# Patient Record
Sex: Female | Born: 1935 | Race: Black or African American | Hispanic: No | Marital: Married | Smoking: Former smoker
Health system: Southern US, Community
[De-identification: ages and names within clinical notes are randomized; demographics above are authoritative.]

## PROBLEM LIST (undated history)

## (undated) DIAGNOSIS — E079 Disorder of thyroid, unspecified: Secondary | ICD-10-CM

## (undated) DIAGNOSIS — I10 Essential (primary) hypertension: Secondary | ICD-10-CM

## (undated) DIAGNOSIS — D649 Anemia, unspecified: Secondary | ICD-10-CM

## (undated) DIAGNOSIS — M1990 Unspecified osteoarthritis, unspecified site: Secondary | ICD-10-CM

## (undated) DIAGNOSIS — K219 Gastro-esophageal reflux disease without esophagitis: Secondary | ICD-10-CM

## (undated) HISTORY — DX: Disorder of thyroid, unspecified: E07.9

## (undated) HISTORY — DX: Anemia, unspecified: D64.9

## (undated) HISTORY — DX: Essential (primary) hypertension: I10

## (undated) HISTORY — DX: Unspecified osteoarthritis, unspecified site: M19.90

## (undated) HISTORY — DX: Gastro-esophageal reflux disease without esophagitis: K21.9

## (undated) HISTORY — PX: ABDOMINAL HYSTERECTOMY: SHX81

---

## 1997-09-03 ENCOUNTER — Other Ambulatory Visit: Admission: RE | Admit: 1997-09-03 | Discharge: 1997-09-03 | Payer: Self-pay | Admitting: Obstetrics and Gynecology

## 1998-12-30 ENCOUNTER — Other Ambulatory Visit: Admission: RE | Admit: 1998-12-30 | Discharge: 1998-12-30 | Payer: Self-pay | Admitting: Obstetrics and Gynecology

## 2000-01-10 ENCOUNTER — Other Ambulatory Visit: Admission: RE | Admit: 2000-01-10 | Discharge: 2000-01-10 | Payer: Self-pay | Admitting: Obstetrics and Gynecology

## 2001-01-21 ENCOUNTER — Other Ambulatory Visit: Admission: RE | Admit: 2001-01-21 | Discharge: 2001-01-21 | Payer: Self-pay | Admitting: Obstetrics and Gynecology

## 2002-02-12 ENCOUNTER — Other Ambulatory Visit: Admission: RE | Admit: 2002-02-12 | Discharge: 2002-02-12 | Payer: Self-pay | Admitting: Obstetrics and Gynecology

## 2003-06-03 ENCOUNTER — Ambulatory Visit (HOSPITAL_COMMUNITY): Admission: RE | Admit: 2003-06-03 | Discharge: 2003-06-03 | Payer: Self-pay | Admitting: Internal Medicine

## 2003-07-26 ENCOUNTER — Ambulatory Visit (HOSPITAL_COMMUNITY): Admission: RE | Admit: 2003-07-26 | Discharge: 2003-07-26 | Payer: Self-pay | Admitting: Neurology

## 2006-03-20 ENCOUNTER — Ambulatory Visit: Payer: Self-pay | Admitting: Internal Medicine

## 2006-03-28 ENCOUNTER — Ambulatory Visit: Payer: Self-pay | Admitting: Internal Medicine

## 2008-01-16 ENCOUNTER — Ambulatory Visit (HOSPITAL_COMMUNITY): Admission: RE | Admit: 2008-01-16 | Discharge: 2008-01-16 | Payer: Self-pay | Admitting: Orthopedic Surgery

## 2011-03-02 ENCOUNTER — Encounter: Payer: Self-pay | Admitting: Internal Medicine

## 2011-05-02 DIAGNOSIS — N951 Menopausal and female climacteric states: Secondary | ICD-10-CM

## 2011-05-02 DIAGNOSIS — I10 Essential (primary) hypertension: Secondary | ICD-10-CM

## 2011-06-13 ENCOUNTER — Ambulatory Visit (INDEPENDENT_AMBULATORY_CARE_PROVIDER_SITE_OTHER): Payer: PRIVATE HEALTH INSURANCE | Admitting: Obstetrics and Gynecology

## 2011-06-13 DIAGNOSIS — N951 Menopausal and female climacteric states: Secondary | ICD-10-CM

## 2011-12-14 ENCOUNTER — Encounter: Payer: Self-pay | Admitting: Internal Medicine

## 2012-01-06 ENCOUNTER — Encounter: Payer: Self-pay | Admitting: Obstetrics and Gynecology

## 2012-01-06 DIAGNOSIS — R921 Mammographic calcification found on diagnostic imaging of breast: Secondary | ICD-10-CM | POA: Insufficient documentation

## 2012-01-06 DIAGNOSIS — R922 Inconclusive mammogram: Secondary | ICD-10-CM | POA: Insufficient documentation

## 2012-01-07 ENCOUNTER — Other Ambulatory Visit: Payer: Self-pay

## 2012-01-16 ENCOUNTER — Encounter: Payer: Self-pay | Admitting: Obstetrics and Gynecology

## 2012-02-28 ENCOUNTER — Telehealth: Payer: Self-pay | Admitting: Obstetrics and Gynecology

## 2016-07-18 ENCOUNTER — Ambulatory Visit (HOSPITAL_COMMUNITY)
Admission: EM | Admit: 2016-07-18 | Discharge: 2016-07-18 | Disposition: A | Discharge status: Home / Self Care | Payer: Medicare Other | Attending: Internal Medicine | Admitting: Internal Medicine

## 2016-07-18 ENCOUNTER — Encounter (HOSPITAL_COMMUNITY): Payer: Self-pay | Admitting: Family Medicine

## 2016-07-18 DIAGNOSIS — M25562 Pain in left knee: Secondary | ICD-10-CM | POA: Diagnosis not present

## 2016-07-18 DIAGNOSIS — S86912A Strain of unspecified muscle(s) and tendon(s) at lower leg level, left leg, initial encounter: Secondary | ICD-10-CM

## 2016-07-18 NOTE — ED Triage Notes (Signed)
Pt here for pain to left leg after attending water aerobics a few days ago. sts she feels a strain to the medial part of knee and leg.

## 2016-07-18 NOTE — Discharge Instructions (Signed)
It is likely that during the stretches and work out that he strained one of the muscles that crosses over the inside of the knee joint. For now use ice to the area for a couple days and then switch to heat. Limit your activity, especially with weightbearing. No exercises with your left leg until your pain has subsided and you have no additional tenderness or swelling. If this does persist follow-up with your PCP or orthopedist as needed.

## 2016-07-18 NOTE — ED Provider Notes (Signed)
CSN: [id number]     Arrival date & time 07/18/16  1151 History   First MD Initiated Contact with Patient 07/18/16 1355     Chief Complaint  Patient presents with  . Knee Pain   (Consider location/radiation/quality/duration/timing/severity/associated sxs/prior Treatment) 82 year old female is involved with water aerobics. 2-3 days ago when she went for her exercises her instructor pushed a little harder in stretching and states that she may have over stretched her legs and "overdid it ". This also goes for the water exercises. She developed pain to the distal medial thigh with pain tracking to the proximal tibia. She also notes that there is some swelling to the knee. She complains of pain when she stands up and bears weight. Denies fall or other trauma.      Past Medical History:  Diagnosis Date  . Arthritis   . GERD (gastroesophageal reflux disease)   . Hypertension    Past Surgical History:  Procedure Laterality Date  . ABDOMINAL HYSTERECTOMY     History reviewed. No pertinent family history. Social History  Substance Use Topics  . Smoking status: Never Smoker  . Smokeless tobacco: Never Used  . Alcohol use Not on file   OB History    No data available     Review of Systems  Constitutional: Negative for activity change, chills and fever.  Respiratory: Negative.   Cardiovascular: Negative.   Musculoskeletal: Positive for joint swelling and myalgias.       As per HPI  Skin: Negative for color change, pallor and rash.  Neurological: Negative.   All other systems reviewed and are negative.   Allergies  Patient has no known allergies.  Home Medications   Prior to Admission medications   Medication Sig Start Date End Date Taking? Authorizing Provider  estrogens, conjugated, (PREMARIN) 0.45 MG tablet Take 0.45 mg by mouth daily. Take daily for 21 days then do not take for 7 days. 05/31/10   [provider]   Meds Ordered and Administered this Visit   Medications - No data to display  BP (!) 130/44   Pulse 71   Temp 98.2 F (36.8 C)   Resp 18   SpO2 100%  No data found.   Physical Exam  Constitutional: She is oriented to person, place, and time. She appears well-developed and well-nourished. No distress.  HENT:  Head: Normocephalic and atraumatic.  Eyes: EOM are normal.  Neck: Neck supple.  Cardiovascular: Normal rate.   Pulmonary/Chest: Effort normal.  Musculoskeletal: She exhibits edema and tenderness. She exhibits no deformity.  Tenderness to the medial aspect of the distal thigh which tracks along the medial aspect of the knee toward the tibia. This is likely the sartorius or Grasilas muscle. There is mild puffiness to the medial joint space. No tenderness to the patella or patellar tendon. Flexion and extension is intact. No laxity appreciated. Negative drawer test.  Neurological: She is alert and oriented to person, place, and time. No cranial nerve deficit.  Skin: Skin is warm and dry.  Nursing note and vitals reviewed.   Urgent Care Course     Procedures (including critical care time)  Labs Review Labs Reviewed - No data to display  Imaging Review No results found.   Visual Acuity Review  Right Eye Distance:   Left Eye Distance:   Bilateral Distance:    Right Eye Near:   Left Eye Near:    Bilateral Near:         MDM   1.  Acute pain of left knee   2. Strain of left knee, initial encounter    It is likely that during the stretches and work out that he strained one of the muscles that crosses over the inside of the knee joint. For now use ice to the area for a couple days and then switch to heat. Limit your activity, especially with weightbearing. No exercises with your left leg until your pain has subsided and you have no additional tenderness or swelling. If this does persist follow-up with your PCP or orthopedist as needed.     Hayden Rasmussen, NP 07/18/16 1421

## 2017-07-18 ENCOUNTER — Encounter: Payer: Self-pay | Admitting: Podiatry

## 2017-07-31 NOTE — Progress Notes (Signed)
This encounter was created in error - please disregard.

## 2017-08-02 ENCOUNTER — Ambulatory Visit: Payer: Medicare Other | Admitting: Podiatry

## 2017-08-02 ENCOUNTER — Ambulatory Visit (INDEPENDENT_AMBULATORY_CARE_PROVIDER_SITE_OTHER): Payer: Medicare Other

## 2017-08-02 ENCOUNTER — Encounter: Payer: Self-pay | Admitting: Podiatry

## 2017-08-02 VITALS — BP 96/55 | HR 73 | Resp 16

## 2017-08-02 DIAGNOSIS — K219 Gastro-esophageal reflux disease without esophagitis: Secondary | ICD-10-CM | POA: Insufficient documentation

## 2017-08-02 DIAGNOSIS — M779 Enthesopathy, unspecified: Secondary | ICD-10-CM | POA: Diagnosis not present

## 2017-08-02 DIAGNOSIS — L84 Corns and callosities: Secondary | ICD-10-CM

## 2017-08-02 DIAGNOSIS — M2041 Other hammer toe(s) (acquired), right foot: Secondary | ICD-10-CM

## 2017-08-02 DIAGNOSIS — M1990 Unspecified osteoarthritis, unspecified site: Secondary | ICD-10-CM | POA: Insufficient documentation

## 2017-08-02 DIAGNOSIS — M8580 Other specified disorders of bone density and structure, unspecified site: Secondary | ICD-10-CM | POA: Insufficient documentation

## 2017-08-02 MED ORDER — TRIAMCINOLONE ACETONIDE 10 MG/ML IJ SUSP
10.0000 mg | Freq: Once | INTRAMUSCULAR | Status: AC
  Administered 2017-08-02: 10 mg

## 2017-08-02 NOTE — Progress Notes (Signed)
Subjective:   Patient ID: Jaclyn Hill, female   DOB: 83 y.o.   MRN: [id number]   HPI Patient presents stating I have a corn on my right fifth toe that is been very tender.  He is tried corn bad she is tried medication without relief and wider shoes and its been present for at least a month.  Patient does not smoke and likes to be active   Review of Systems  All other systems reviewed and are negative.       Objective:  Physical Exam  Constitutional: She appears well-developed and well-nourished.  Cardiovascular: Intact distal pulses.  Pulmonary/Chest: Effort normal.  Musculoskeletal: Normal range of motion.  Neurological: She is alert.  Skin: Skin is warm.  Nursing note and vitals reviewed.   Neurovascular status intact muscle strength adequate range of motion within normal limits with patient found to have a painful right fifth digit had a proximal phalanx with keratotic lesion that is very sore when palpated.  Patient is noted to have good digital perfusion is well oriented     Assessment:  Hammertoe deformity fifth right with keratotic lesion and inner phalangeal joint capsulitis     Plan:  H&P condition reviewed and I went ahead the proximal nerve block of the right fifth digit discussed hammertoe with her and injected the interphalangeal joint with 1 mg Dexasone Kenalog and then using sharp sterile debridement debrided lesion on top of the fifth toe.  Patient tolerated procedures well will be seen back if symptomatic and may ultimately require surgery  X-ray indicates hypertrophy enlargement of the head of proximal phalanx digit 5 right

## 2018-01-08 ENCOUNTER — Ambulatory Visit: Payer: Medicare Other | Admitting: Podiatry

## 2018-01-08 DIAGNOSIS — M2041 Other hammer toe(s) (acquired), right foot: Secondary | ICD-10-CM

## 2018-01-08 NOTE — Progress Notes (Signed)
Subjective:   Patient ID: Jaclyn Hill, female   DOB: 83 y.o.   MRN: [id number]   HPI Patient presents stating her right fifth toe is killing her and she is trying wider shoes she is tried trimming and padding and is no longer giving her any relief and what we did last time Hill her no relief and she had surgery on the left one and it did very well   ROS      Objective:  Physical Exam  Neurovascular status intact with good digital perfusion patient found to have keratotic lesion fifth digit right that is painful when pressed on the head of the proximal phalanx     Assessment:  Hammertoe deformity fifth digit right with keratotic lesion     Plan:  H&P condition reviewed and recommended arthroplasty procedure.  Explained procedure and risk and patient wants surgery and I allowed her to read consent form going over alternative treatments complications associated with it.  She is willing to accept risk of procedure and after extensive review signed consent form is scheduled for outpatient surgery next week and is encouraged to call with any questions concerns she may have prior to procedure

## 2018-01-08 NOTE — Patient Instructions (Signed)
Pre-Operative Instructions  Congratulations, you have decided to take an important step towards improving your quality of life.  You can be assured that the doctors and staff at [facility] will be with you every step of the way.  Here are some important things you should know:  1. Plan to be at the surgery center/hospital at least 1 (one) hour prior to your scheduled time, unless otherwise directed by the surgical center/hospital staff.  You must have a responsible adult accompany you, remain during the surgery and drive you home.  Make sure you have directions to the surgical center/hospital to ensure you arrive on time. 2. If you are having surgery at [facility] or [facility] hospitals, you will need a copy of your medical history and physical form from your family physician within one month prior to the date of surgery. We will give you a form for your primary physician to complete.  3. We make every effort to accommodate the date you request for surgery.  However, there are times where surgery dates or times have to be moved.  We will contact you as soon as possible if a change in schedule is required.   4. No aspirin/ibuprofen for one week before surgery.  If you are on aspirin, any non-steroidal anti-inflammatory medications (Mobic, Aleve, Ibuprofen) should not be taken seven (7) days prior to your surgery.  You make take Tylenol for pain prior to surgery.  5. Medications - If you are taking daily heart and blood pressure medications, seizure, reflux, allergy, asthma, anxiety, pain or diabetes medications, make sure you notify the surgery center/hospital before the day of surgery so they can tell you which medications you should take or avoid the day of surgery. 6. No food or drink after midnight the night before surgery unless directed otherwise by surgical center/hospital staff. 7. No alcoholic beverages 24-hours prior to surgery.  No smoking 24-hours prior or 24-hours after  surgery. 8. Wear loose pants or shorts. They should be loose enough to fit over bandages, boots, and casts. 9. Don't wear slip-on shoes. Sneakers are preferred. 10. Bring your boot with you to the surgery center/hospital.  Also bring crutches or a walker if your physician has prescribed it for you.  If you do not have this equipment, it will be provided for you after surgery. 11. If you have not been contacted by the surgery center/hospital by the day before your surgery, call to confirm the date and time of your surgery. 12. Leave-time from work may vary depending on the type of surgery you have.  Appropriate arrangements should be made prior to surgery with your employer. 13. Prescriptions will be provided immediately following surgery by your doctor.  Fill these as soon as possible after surgery and take the medication as directed. Pain medications will not be refilled on weekends and must be approved by the doctor. 14. Remove nail polish on the operative foot and avoid getting pedicures prior to surgery. 15. Wash the night before surgery.  The night before surgery wash the foot and leg well with water and the antibacterial soap provided. Be sure to pay special attention to beneath the toenails and in between the toes.  Wash for at least three (3) minutes. Rinse thoroughly with water and dry well with a towel.  Perform this wash unless told not to do so by your physician.  Enclosed: 1 Ice pack (please put in freezer the night before surgery)   1 Hibiclens skin cleaner     Pre-op instructions  If you have any questions regarding the instructions, please do not hesitate to call our office.  [place]: [address], [place], [place] [address] -- [phone]  [place]: [address] [place], [place] [address] -- [phone]  [place]: [address]  [place], [place] [address] -- [phone]  [place]: [address], [address], [place], [place] [address] -- [phone]  Website:  [email or web address] 

## 2018-01-14 ENCOUNTER — Encounter: Payer: Self-pay | Admitting: Podiatry

## 2018-01-14 DIAGNOSIS — M2041 Other hammer toe(s) (acquired), right foot: Secondary | ICD-10-CM

## 2018-01-20 ENCOUNTER — Ambulatory Visit (INDEPENDENT_AMBULATORY_CARE_PROVIDER_SITE_OTHER): Payer: Medicare Other

## 2018-01-20 ENCOUNTER — Encounter: Payer: Self-pay | Admitting: Podiatry

## 2018-01-20 ENCOUNTER — Ambulatory Visit (INDEPENDENT_AMBULATORY_CARE_PROVIDER_SITE_OTHER): Payer: Self-pay | Admitting: Podiatry

## 2018-01-20 DIAGNOSIS — M2041 Other hammer toe(s) (acquired), right foot: Secondary | ICD-10-CM | POA: Diagnosis not present

## 2018-01-20 NOTE — Progress Notes (Signed)
Subjective:   Patient ID: Jaclyn Hill, female   DOB: 83 y.o.   MRN: [id number]   HPI Patient states the toe is doing well with minimal discomfort with wound edges well coapted   ROS      Objective:  Physical Exam  Neurovascular status intact with patient's fifth digit in good alignment wound edges well coapted good structural position of the toe     Assessment:  Doing well post arthroplasty fifth digit right     Plan:  Reviewed x-rays and reapplied sterile dressing advised on continued open toed shoes and reappoint for suture removal 2 weeks or earlier if needed  X-rays indicated satisfactory section had a proximal phalanx digit 5 right

## 2018-02-05 ENCOUNTER — Ambulatory Visit (INDEPENDENT_AMBULATORY_CARE_PROVIDER_SITE_OTHER): Payer: Medicare Other | Admitting: Podiatry

## 2018-02-05 ENCOUNTER — Ambulatory Visit (INDEPENDENT_AMBULATORY_CARE_PROVIDER_SITE_OTHER): Payer: Medicare Other

## 2018-02-05 ENCOUNTER — Encounter: Payer: Self-pay | Admitting: Podiatry

## 2018-02-05 DIAGNOSIS — M2041 Other hammer toe(s) (acquired), right foot: Secondary | ICD-10-CM | POA: Diagnosis not present

## 2018-02-05 NOTE — Progress Notes (Signed)
Subjective:   Patient ID: Jaclyn GaveAlice P Hill, female   DOB: 83 y.o.   MRN: [id number]   HPI Patient states she is doing great and ready to get stitches removed   ROS      Objective:  Physical Exam  Neurovascular status intact stitches in place fifth digit right with good alignment     Assessment:  Doing well post arthroplasty fifth digit right     Plan:  Stitches removed wound edges coapted patient's discharge will be seen back as needed  X-ray indicates excellent removal had a proximal valgus with good alignment noted

## 2019-04-26 ENCOUNTER — Ambulatory Visit: Payer: Medicare Other | Attending: Internal Medicine

## 2019-04-26 ENCOUNTER — Other Ambulatory Visit: Payer: Self-pay

## 2019-04-26 DIAGNOSIS — Z23 Encounter for immunization: Secondary | ICD-10-CM | POA: Insufficient documentation

## 2019-04-26 NOTE — Progress Notes (Signed)
   Covid-19 Vaccination Clinic  Name:  Jaclyn Hill    MRN: [id number] DOB: August 18, 1934  04/26/2019  [name] was observed post Covid-19 immunization for 15 minutes without incidence. She was provided with Vaccine Information Sheet and instruction to access the V-Safe system.   [name] was instructed to call 911 with any severe reactions post vaccine: Marland Kitchen Difficulty breathing  . Swelling of your face and throat  . A fast heartbeat  . A bad rash all over your body  . Dizziness and weakness    Immunizations Administered    Name Date Dose VIS Date Route   Moderna COVID-19 Vaccine 04/26/2019  1:15 PM 0.5 mL 02/10/2019 Intramuscular   Manufacturer: Moderna   Lot: [id number]   NDC: 53005-110-21

## 2019-05-24 ENCOUNTER — Ambulatory Visit: Payer: Medicare Other | Attending: Internal Medicine

## 2019-05-24 DIAGNOSIS — Z23 Encounter for immunization: Secondary | ICD-10-CM

## 2019-05-24 NOTE — Progress Notes (Signed)
   Covid-19 Vaccination Clinic  Name:  Jaclyn Hill    MRN: [id number] DOB: 1935-02-16  05/24/2019  Jaclyn Hill was observed post Covid-19 immunization for 15 minutes without incident. She was provided with Vaccine Information Sheet and instruction to access the V-Safe system.   Jaclyn Hill was instructed to call 911 with any severe reactions post vaccine: Marland Kitchen Difficulty breathing  . Swelling of face and throat  . A fast heartbeat  . A bad rash all over body  . Dizziness and weakness   Immunizations Administered    Name Date Dose VIS Date Route   Moderna COVID-19 Vaccine 05/24/2019 12:58 PM 0.5 mL 02/10/2019 Intramuscular   Manufacturer: Moderna   Lot: [id number]   NDC: 31594-585-92

## 2019-09-21 ENCOUNTER — Encounter (HOSPITAL_COMMUNITY): Payer: Self-pay

## 2019-09-21 ENCOUNTER — Ambulatory Visit (HOSPITAL_COMMUNITY)
Admission: EM | Admit: 2019-09-21 | Discharge: 2019-09-21 | Disposition: A | Discharge status: Home / Self Care | Payer: Medicare Other | Attending: Physician Assistant | Admitting: Physician Assistant

## 2019-09-21 DIAGNOSIS — K047 Periapical abscess without sinus: Secondary | ICD-10-CM

## 2019-09-21 MED ORDER — CLINDAMYCIN HCL 150 MG PO CAPS: 150.0000 mg | ORAL_CAPSULE | Freq: Three times a day (TID) | ORAL | 0 refills | Status: DC

## 2019-09-21 NOTE — ED Provider Notes (Signed)
[facility]    CSN: [id number] Arrival date & time: 09/21/19  1508      History   Chief Complaint Chief Complaint  Patient presents with  . Dental Pain    HPI Jaclyn Hill is a 85 y.o. female.   The history is provided by the patient. No language interpreter was used.  Dental Pain Location:  Lower Lower teeth location:  29/RL 2nd bicuspid Quality:  Aching Severity:  Moderate Onset quality:  Gradual Timing:  Constant Progression:  Worsening Chronicity:  New Relieved by:  Nothing Worsened by:  Nothing Ineffective treatments:  None tried Risk factors: periodontal disease   Risk factors: no smoking   Pt reports no dental care since 2005.  Pt complains of swelling to lower gum   Past Medical History:  Diagnosis Date  . Arthritis   . GERD (gastroesophageal reflux disease)   . Hypertension     Patient Active Problem List   Diagnosis Date Noted  . Osteopenia 08/02/2017  . Gastroesophageal reflux disease 08/02/2017  . Arthritis 08/02/2017  . Dense breasts 01/06/2012  . Breast calcification, right 01/06/2012  . Hypertension 02/12/2002    Class: History of  . Menopausal symptoms 08/24/1988    Past Surgical History:  Procedure Laterality Date  . ABDOMINAL HYSTERECTOMY      OB History   No obstetric history on file.      Home Medications    Prior to Admission medications   Medication Sig Start Date End Date Taking? Authorizing Provider  aspirin EC 81 MG tablet Take 81 mg by mouth daily.    [provider]  BLACK COHOSH PO Take by mouth.    [provider]  calcium-vitamin D (OSCAL WITH D) 500-200 MG-UNIT tablet Take 1 tablet by mouth.    [provider]  clindamycin (CLEOCIN) 150 MG capsule Take 1 capsule (150 mg total) by mouth 3 (three) times daily. 09/21/19   Elson Areas, PA-C  cyanocobalamin (,VITAMIN B-12,) 1000 MCG/ML injection INJECT 1 ML MONTHLY 11/20/17   [provider]  Cyanocobalamin (B-12  IJ) Inject as directed.    [provider]  lisinopril-hydrochlorothiazide (PRINZIDE,ZESTORETIC) 20-12.5 MG tablet lisinopril 20 mg-hydrochlorothiazide 12.5 mg tablet    [provider]  Multiple Vitamin (MULTIVITAMIN) capsule Take 1 capsule by mouth daily.    [provider]  SYMBICORT 160-4.5 MCG/ACT inhaler Inhale 2 puffs into the lungs 2 (two) times daily. 05/01/17 09/21/19  [provider]    Family History No family history on file.  Social History Social History   Tobacco Use  . Smoking status: Former Games developer  . Smokeless tobacco: Never Used  Substance Use Topics  . Alcohol use: Never  . Drug use: Never     Allergies   Patient has no known allergies.   Review of Systems Review of Systems  All other systems reviewed and are negative.    Physical Exam Triage Vital Signs ED Triage Vitals  Enc Vitals Group     BP 09/21/19 1554 (!) 140/53     Pulse Rate 09/21/19 1554 71     Resp 09/21/19 1554 18     Temp 09/21/19 1554 98.8 F (37.1 C)     Temp Source 09/21/19 1554 Oral     SpO2 09/21/19 1554 100 %     Weight 09/21/19 1557 160 lb (72.6 kg)     Height 09/21/19 1557 5\' 6"  (1.676 m)     Head Circumference --  Peak Flow --      Pain Score 09/21/19 1556 7     Pain Loc --      Pain Edu? --      Excl. in GC? --    No data found.  Updated Vital Signs BP (!) 140/53   Pulse 71   Temp 98.8 F (37.1 C) (Oral)   Resp 18   Ht 5\' 6"  (1.676 m)   Wt 72.6 kg   SpO2 100%   BMI 25.82 kg/m   Visual Acuity Right Eye Distance:   Left Eye Distance:   Bilateral Distance:    Right Eye Near:   Left Eye Near:    Bilateral Near:     Physical Exam Vitals and nursing note reviewed.  Constitutional:      Appearance: She is well-developed.  HENT:     Head: Normocephalic.     Ears:     Comments: Swollen lower gumline, swollen left lower face, no sign of ludwigs  Pulmonary:     Effort: Pulmonary effort is normal.  Abdominal:      General: There is no distension.  Musculoskeletal:        General: Normal range of motion.     Cervical back: Normal range of motion.  Neurological:     Mental Status: She is alert and oriented to person, place, and time.  Psychiatric:        Mood and Affect: Mood normal.      UC Treatments / Results  Labs (all labs ordered are listed, but only abnormal results are displayed) Labs Reviewed - No data to display  EKG   Radiology No results found.  Procedures Procedures (including critical care time)  Medications Ordered in UC Medications - No data to display  Initial Impression / Assessment and Plan / UC Course  I have reviewed the triage vital signs and the nursing notes.  Pertinent labs & imaging results that were available during my care of the patient were reviewed by me and considered in my medical decision making (see chart for details).     MDM:  Pt referred to dentist on call.  Pt given rx for clindamycin  Final Clinical Impressions(s) / UC Diagnoses   Final diagnoses:  Dental infection     Discharge Instructions     Return if any problems.    ED Prescriptions    Medication Sig Dispense Auth. Provider   clindamycin (CLEOCIN) 150 MG capsule Take 1 capsule (150 mg total) by mouth 3 (three) times daily. 21 capsule , Elson Areas     PDMP not reviewed this encounter.  An After Visit Summary was printed and given to the patient.    [place], Elson Areas 09/21/19 1734

## 2019-09-21 NOTE — ED Triage Notes (Signed)
PT c/o left lower tooth pain and left jaw swellingx2 days. Pt has 1+ swelling of left jaw.

## 2019-09-21 NOTE — Discharge Instructions (Signed)
Return if any problems.

## 2019-12-09 ENCOUNTER — Other Ambulatory Visit: Payer: Self-pay | Admitting: Internal Medicine

## 2019-12-09 DIAGNOSIS — R634 Abnormal weight loss: Secondary | ICD-10-CM

## 2019-12-23 ENCOUNTER — Ambulatory Visit
Admission: RE | Admit: 2019-12-23 | Discharge: 2019-12-23 | Disposition: A | Discharge status: Home / Self Care | Payer: Medicare Other | Source: Ambulatory Visit | Attending: Internal Medicine | Admitting: Internal Medicine

## 2019-12-23 ENCOUNTER — Other Ambulatory Visit: Payer: Self-pay | Admitting: Internal Medicine

## 2019-12-23 ENCOUNTER — Other Ambulatory Visit: Payer: Self-pay

## 2019-12-23 DIAGNOSIS — R634 Abnormal weight loss: Secondary | ICD-10-CM

## 2020-01-22 ENCOUNTER — Encounter: Payer: Self-pay | Admitting: Physician Assistant

## 2020-02-11 ENCOUNTER — Encounter: Payer: Self-pay | Admitting: Physician Assistant

## 2020-02-11 ENCOUNTER — Ambulatory Visit: Payer: Medicare Other | Admitting: Physician Assistant

## 2020-02-11 VITALS — BP 132/52 | HR 68 | Ht 62.0 in | Wt 144.0 lb

## 2020-02-11 DIAGNOSIS — R933 Abnormal findings on diagnostic imaging of other parts of digestive tract: Secondary | ICD-10-CM

## 2020-02-11 DIAGNOSIS — R634 Abnormal weight loss: Secondary | ICD-10-CM | POA: Diagnosis not present

## 2020-02-11 DIAGNOSIS — R935 Abnormal findings on diagnostic imaging of other abdominal regions, including retroperitoneum: Secondary | ICD-10-CM | POA: Diagnosis not present

## 2020-02-11 NOTE — Progress Notes (Signed)
Subjective:    Patient ID: Jaclyn Hill, female    DOB: 05/11/1934, 85 y.o.   MRN: [id number]  HPI Jaclyn Hill is a pleasant 85 year old African-American female, new to GI today referred by Dr. Jacky Kindle for evaluation of weight loss and abnormal CT scan of the esophagus and stomach. Patient is known remotely to Dr. Marina Goodell and was last seen in 2008 when she underwent colonoscopy which was normal.  She has history of hypertension, chronic kidney disease, anemia and B12 deficiency. She has apparently had about a 13 pound weight loss this past year, unexplained.  For that reason she underwent lab evaluation in September 2021 with finding of hemoglobin 10.3/hematocrit of 32.4 MCV of 82 and normal LFTs.  I believe she has chronic history of anemia.  She then had CT of the abdomen and pelvis done on 12/23/2019 which showed the gallbladder to be absent, there is soft tissue thickening at the esophagogastric junction and at the gastric fundus, there is a cyst 5.8 cm of the left kidney and also noted to have advanced degenerative changes in the lower lumbar spine as well as atherosclerotic changes. Patient says her weight has been stable over the past couple of weeks.  Her appetite has been good though she says she just eats less than she used to when she was younger.  She has no complaints of dysphagia or odynophagia, no heartburn or indigestion.  No complaints of abdominal pain or discomfort no nausea or vomiting, no changes in bowel habits or melena or hematochezia. She had not had any specific changes to her diet or activity level and no new meds that she can recall.  She says she had been exercising with water aerobics prior to the onset of the pandemic and says she started noticing a little bit of weight loss around that time but had not been going to the gym over the past year and a half though she does still ride her stationary bike sometimes.  Review of Systems Pertinent positive and negative review of  systems were noted in the above HPI section.  All other review of systems was otherwise negative.  Outpatient Encounter Medications as of 02/11/2020  Medication Sig  . aspirin EC 81 MG tablet Take 81 mg by mouth daily.  . calcium-vitamin D (OSCAL WITH D) 500-200 MG-UNIT tablet Take 1 tablet by mouth.  . cyanocobalamin (,VITAMIN B-12,) 1000 MCG/ML injection INJECT 1 ML MONTHLY  . lisinopril-hydrochlorothiazide (PRINZIDE,ZESTORETIC) 20-12.5 MG tablet lisinopril 20 mg-hydrochlorothiazide 12.5 mg tablet  . Multiple Vitamin (MULTIVITAMIN) capsule Take 1 capsule by mouth daily.  . [DISCONTINUED] BLACK COHOSH PO Take by mouth.  . [DISCONTINUED] clindamycin (CLEOCIN) 150 MG capsule Take 1 capsule (150 mg total) by mouth 3 (three) times daily.  . [DISCONTINUED] Cyanocobalamin (B-12 IJ) Inject as directed.  . [DISCONTINUED] SYMBICORT 160-4.5 MCG/ACT inhaler Inhale 2 puffs into the lungs 2 (two) times daily.   No facility-administered encounter medications on file as of 02/11/2020.   No Known Allergies Patient Active Problem List   Diagnosis Date Noted  . Osteopenia 08/02/2017  . Gastroesophageal reflux disease 08/02/2017  . Arthritis 08/02/2017  . Dense breasts 01/06/2012  . Breast calcification, right 01/06/2012  . Hypertension 02/12/2002  . Menopausal symptoms 08/24/1988   Social History   Socioeconomic History  . Marital status: Married    Spouse name: Not on file  . Number of children: Not on file  . Years of education: Not on file  . Highest education level: Not on  file  Occupational History  . Not on file  Tobacco Use  . Smoking status: Former Games developer  . Smokeless tobacco: Never Used  Vaping Use  . Vaping Use: Never used  Substance and Sexual Activity  . Alcohol use: Never  . Drug use: Never  . Sexual activity: Not on file  Other Topics Concern  . Not on file  Social History Narrative  . Not on file   Social Determinants of Health   Financial Resource Strain:   .  Difficulty of Paying Living Expenses: Not on file  Food Insecurity:   . Worried About Programme researcher, broadcasting/film/video in the Last Year: Not on file  . Ran Out of Food in the Last Year: Not on file  Transportation Needs:   . Lack of Transportation (Medical): Not on file  . Lack of Transportation (Non-Medical): Not on file  Physical Activity:   . Days of Exercise per Week: Not on file  . Minutes of Exercise per Session: Not on file  Stress:   . Feeling of Stress : Not on file  Social Connections:   . Frequency of Communication with Friends and Family: Not on file  . Frequency of Social Gatherings with Friends and Family: Not on file  . Attends Religious Services: Not on file  . Active Member of Clubs or Organizations: Not on file  . Attends Banker Meetings: Not on file  . Marital Status: Not on file  Intimate Partner Violence:   . Fear of Current or Ex-Partner: Not on file  . Emotionally Abused: Not on file  . Physically Abused: Not on file  . Sexually Abused: Not on file    Jaclyn Hill's family history includes Breast cancer in her mother; Diabetes in her sister.      Objective:    Vitals:   02/11/20 1040  BP: (!) 132/52  Pulse: 68  SpO2: 99%    Physical Exam Well-developed well-nourished elderly  AA female  in no acute distress. Accompanied by husband Height, [id number], BMI 26.3  HEENT; nontraumatic normocephalic, EOMI, PE R LA, sclera anicteric. Oropharynx; not examined Neck; supple, no JVD Cardiovascular; regular rate and rhythm with S1-S2, no murmur rub or gallop Pulmonary; Clear bilaterally Abdomen; soft, nontender, nondistended, no palpable mass or hepatosplenomegaly, bowel sounds are active Rectal; not done today Skin; benign exam, no jaundice rash or appreciable lesions Extremities; no clubbing cyanosis or edema skin warm and dry Neuro/Psych; alert and oriented x4, grossly nonfocal mood and affect appropriate       Assessment & Plan:   #8 85 year old  African-American female with a 13 pound weight loss over the past 1 year,, and recent CT of the abdomen pelvis showing soft tissue thickening at the EG junction and gastric fundus. Some of these changes may be due to under distention, rule out esophagitis, rule out occult neoplasm.  #2 chronic kidney disease stage III 4.  History of chronic anemia and B12 deficiency 5.  Hypertension 6.  Colon cancer screening-negative colonoscopy 2008  Plan; Patient will be scheduled for Upper endoscopy with Dr. Marina Goodell.  Procedure was discussed in detail with patient including indications risk and benefits and she is agreeable to proceed Patient has completed COVID-19 vaccination. Further recommendations pending findings at EGD.   Jaclyn Lampley Oswald Hillock PA-C 02/11/2020   Cc: Geoffry Paradise, MD

## 2020-02-11 NOTE — Patient Instructions (Signed)
If you are age 83 or older, your body mass index should be between 23-30. Your Body mass index is 26.34 kg/m. If this is out of the aforementioned range listed, please consider follow up with your Primary Care Provider.  If you are age 65 or younger, your body mass index should be between 19-25. Your Body mass index is 26.34 kg/m. If this is out of the aformentioned range listed, please consider follow up with your Primary Care Provider.   You have been scheduled for an endoscopy. Please follow written instructions given to you at your visit today. If you use inhalers (even only as needed), please bring them with you on the day of your procedure.  Thank you for entrusting me with your care and choosing [facility].  Amy Esterwood, PA-C

## 2020-02-11 NOTE — Progress Notes (Signed)
Excellent assessment and plan reviewed regarding this delightful octogenarian scheduled for upper endoscopy

## 2020-02-22 ENCOUNTER — Other Ambulatory Visit: Payer: Self-pay

## 2020-02-22 ENCOUNTER — Encounter: Payer: Self-pay | Admitting: Internal Medicine

## 2020-02-22 ENCOUNTER — Ambulatory Visit (AMBULATORY_SURGERY_CENTER): Payer: Medicare Other | Admitting: Internal Medicine

## 2020-02-22 VITALS — BP 140/55 | HR 65 | Temp 97.8°F | Resp 11 | Ht 62.0 in | Wt 144.0 lb

## 2020-02-22 DIAGNOSIS — K2950 Unspecified chronic gastritis without bleeding: Secondary | ICD-10-CM

## 2020-02-22 DIAGNOSIS — R634 Abnormal weight loss: Secondary | ICD-10-CM | POA: Diagnosis not present

## 2020-02-22 DIAGNOSIS — R935 Abnormal findings on diagnostic imaging of other abdominal regions, including retroperitoneum: Secondary | ICD-10-CM

## 2020-02-22 DIAGNOSIS — K253 Acute gastric ulcer without hemorrhage or perforation: Secondary | ICD-10-CM | POA: Diagnosis not present

## 2020-02-22 DIAGNOSIS — R933 Abnormal findings on diagnostic imaging of other parts of digestive tract: Secondary | ICD-10-CM

## 2020-02-22 HISTORY — PX: UPPER GASTROINTESTINAL ENDOSCOPY: SHX188

## 2020-02-22 MED ORDER — PANTOPRAZOLE SODIUM 40 MG PO TBEC: 40.0000 mg | DELAYED_RELEASE_TABLET | Freq: Every day | ORAL | 11 refills | Status: AC

## 2020-02-22 MED ORDER — SODIUM CHLORIDE 0.9 % IV SOLN: 500.0000 mL | Freq: Once | INTRAVENOUS | Status: DC

## 2020-02-22 NOTE — Progress Notes (Signed)
A and O x3. Report to RN. Tolerated MAC anesthesia well.Teeth unchanged after procedure.

## 2020-02-22 NOTE — Op Note (Signed)
[facility] Patient Name: Jaclyn Hill Procedure Date: 02/22/2020 3:41 PM MRN: [id number] Endoscopist: Wilhemina Bonito. Marina Goodell , MD Age: 85 Referring MD:  Date of Birth: 01-17-1935 Gender: Female Account #: [id number] Procedure:                Upper GI endoscopy with biopsies Indications:              Abnormal CT of the GI tract, Weight loss Medicines:                Monitored Anesthesia Care Procedure:                Pre-Anesthesia Assessment:                           - Prior to the procedure, a History and Physical                            was performed, and patient medications and                            allergies were reviewed. The patient's tolerance of                            previous anesthesia was also reviewed. The risks                            and benefits of the procedure and the sedation                            options and risks were discussed with the patient.                            All questions were answered, and informed consent                            was obtained. Prior Anticoagulants: The patient has                            taken no previous anticoagulant or antiplatelet                            agents. ASA Grade Assessment: II - A patient with                            mild systemic disease. After reviewing the risks                            and benefits, the patient was deemed in                            satisfactory condition to undergo the procedure.                           After obtaining informed consent, the endoscope was  passed under direct vision. Throughout the                            procedure, the patient's blood pressure, pulse, and                            oxygen saturations were monitored continuously. The                            Endoscope was introduced through the mouth, and                            advanced to the second part of duodenum. The upper                             GI endoscopy was accomplished without difficulty.                            The patient tolerated the procedure well. Scope In: Scope Out: Findings:                 The esophagus was normal.                           The stomach revealed a prepyloric ulcer measuring 5                            mm. Biopsies were taken with a cold forceps for                            histology.                           The examined duodenum was normal.                           The cardia and gastric fundus were normal on                            retroflexion. Complications:            No immediate complications. Estimated Blood Loss:     Estimated blood loss: none. Impression:               1. Antral ulcer status post biopsies                           2. Otherwise normal EGD. Recommendation:           1. Prescribe pantoprazole 40 mg daily; #30; 11                            refills                           2. Resume previous diet and other medications  3. Follow-up biopsies                           4. Routine office follow-up with Amy Esterwood PA-C                            in 4 to 6 weeks. Wilhemina Bonito. Marina Goodell, MD 02/22/2020 4:14:10 PM This report has been signed electronically.

## 2020-02-22 NOTE — Patient Instructions (Signed)
YOU HAD AN ENDOSCOPIC PROCEDURE TODAY AT THE [facility]:   Refer to the procedure report that was given to you for any specific questions about what was found during the examination.  If the procedure report does not answer your questions, please call your gastroenterologist to clarify.  If you requested that your care partner not be given the details of your procedure findings, then the procedure report has been included in a sealed envelope for you to review at your convenience later.  YOU SHOULD EXPECT: Some feelings of bloating in the abdomen. Passage of more gas than usual.  Walking can help get rid of the air that was put into your GI tract during the procedure and reduce the bloating. If you had a lower endoscopy (such as a colonoscopy or flexible sigmoidoscopy) you may notice spotting of blood in your stool or on the toilet paper. If you underwent a bowel prep for your procedure, you may not have a normal bowel movement for a few days.  Please Note:  You might notice some irritation and congestion in your nose or some drainage.  This is from the oxygen used during your procedure.  There is no need for concern and it should clear up in a day or so.  SYMPTOMS TO REPORT IMMEDIATELY:    Following upper endoscopy (EGD)  Vomiting of blood or coffee ground material  New chest pain or pain under the shoulder blades  Painful or persistently difficult swallowing  New shortness of breath  Fever of 100F or higher  Black, tarry-looking stools  For urgent or emergent issues, a gastroenterologist can be reached at any hour by calling [phone]. Do not use MyChart messaging for urgent concerns.    DIET:  We do recommend a small meal at first, but then you may proceed to your regular diet.  Drink plenty of fluids but you should avoid alcoholic beverages for 24 hours.  ACTIVITY:  You should plan to take it easy for the rest of today and you should NOT DRIVE or use heavy machinery  until tomorrow (because of the sedation medicines used during the test).    FOLLOW UP: Our staff will call the number listed on your records 48-72 hours following your procedure to check on you and address any questions or concerns that you may have regarding the information given to you following your procedure. If we do not reach you, we will leave a message.  We will attempt to reach you two times.  During this call, we will ask if you have developed any symptoms of COVID 19. If you develop any symptoms (ie: fever, flu-like symptoms, shortness of breath, cough etc.) before then, please call [phone].  If you test positive for Covid 19 in the 2 weeks post procedure, please call and report this information to us.    If any biopsies were taken you will be contacted by phone or by letter within the next 1-3 weeks.  Please call us at [phone] if you have not heard about the biopsies in 3 weeks.    SIGNATURES/CONFIDENTIALITY: You and/or your care partner have signed paperwork which will be entered into your electronic medical record.  These signatures attest to the fact that that the information above on your After Visit Summary has been reviewed and is understood.  Full responsibility of the confidentiality of this discharge information lies with you and/or your care-partner. 

## 2020-02-22 NOTE — Progress Notes (Signed)
Pt's states no medical or surgical changes since previsit or office visit.  Vs JD

## 2020-02-22 NOTE — Progress Notes (Signed)
Called to room to assist during endoscopic procedure.  Patient ID and intended procedure confirmed with present staff. Received instructions for my participation in the procedure from the performing physician.  

## 2020-02-24 ENCOUNTER — Telehealth: Payer: Self-pay | Admitting: *Deleted

## 2020-02-24 NOTE — Telephone Encounter (Signed)
  Follow up Call-  Call back number 02/22/2020  Post procedure Call Back phone  # [phone]  Permission to leave phone message Yes  Some recent data might be hidden     Patient questions:  Do you have a fever, pain , or abdominal swelling? No. Pain Score  0 *  Have you tolerated food without any problems? Yes.    Have you been able to return to your normal activities? Yes.    Do you have any questions about your discharge instructions: Diet   No. Medications  No. Follow up visit  No.  Do you have questions or concerns about your Care? No.  Actions: * If pain score is 4 or above: No action needed, pain <4.  1. Have you developed a fever since your procedure? no  2.   Have you had an respiratory symptoms (SOB or cough) since your procedure? no  3.   Have you tested positive for COVID 19 since your procedure no  4.   Have you had any family members/close contacts diagnosed with the COVID 19 since your procedure?  no   If yes to any of these questions please route to Laverna Peace, RN and Karlton Lemon, RN

## 2020-02-26 ENCOUNTER — Encounter: Payer: Self-pay | Admitting: Internal Medicine

## 2020-04-14 ENCOUNTER — Encounter: Payer: Self-pay | Admitting: Physician Assistant

## 2020-04-14 ENCOUNTER — Ambulatory Visit (INDEPENDENT_AMBULATORY_CARE_PROVIDER_SITE_OTHER): Payer: Medicare Other | Admitting: Physician Assistant

## 2020-04-14 ENCOUNTER — Other Ambulatory Visit: Payer: Self-pay

## 2020-04-14 ENCOUNTER — Other Ambulatory Visit (INDEPENDENT_AMBULATORY_CARE_PROVIDER_SITE_OTHER): Payer: Medicare Other

## 2020-04-14 VITALS — BP 120/68 | HR 86 | Ht 62.0 in | Wt 142.8 lb

## 2020-04-14 DIAGNOSIS — K259 Gastric ulcer, unspecified as acute or chronic, without hemorrhage or perforation: Secondary | ICD-10-CM

## 2020-04-14 DIAGNOSIS — R634 Abnormal weight loss: Secondary | ICD-10-CM

## 2020-04-14 LAB — COMPREHENSIVE METABOLIC PANEL
ALT: 9 U/L (ref 0–35)
AST: 15 U/L (ref 0–37)
Albumin: 4 g/dL (ref 3.5–5.2)
Alkaline Phosphatase: 52 U/L (ref 39–117)
BUN: 31 mg/dL — ABNORMAL HIGH (ref 6–23)
CO2: 31 mEq/L (ref 19–32)
Calcium: 10 mg/dL (ref 8.4–10.5)
Chloride: 105 mEq/L (ref 96–112)
Creatinine, Ser: 1.51 mg/dL — ABNORMAL HIGH (ref 0.40–1.20)
GFR: 31.64 mL/min — ABNORMAL LOW (ref >60.00)
Glucose, Bld: 83 mg/dL (ref 70–99)
Potassium: 3.9 mEq/L (ref 3.5–5.1)
Sodium: 140 mEq/L (ref 135–145)
Total Bilirubin: 0.4 mg/dL (ref 0.2–1.2)
Total Protein: 7.3 g/dL (ref 6.0–8.3)

## 2020-04-14 LAB — CBC WITH DIFFERENTIAL/PLATELET
Basophils Absolute: 0 10*3/uL (ref 0.0–0.1)
Basophils Relative: 0.6 % (ref 0.0–3.0)
Eosinophils Absolute: 0.1 10*3/uL (ref 0.0–0.7)
Eosinophils Relative: 0.8 % (ref 0.0–5.0)
HCT: 32.6 % — ABNORMAL LOW (ref 36.0–46.0)
Hemoglobin: 10.5 g/dL — ABNORMAL LOW (ref 12.0–15.0)
Lymphocytes Relative: 14.7 % (ref 12.0–46.0)
Lymphs Abs: 1 10*3/uL (ref 0.7–4.0)
MCHC: 32.3 g/dL (ref 30.0–36.0)
MCV: 81.9 fl (ref 78.0–100.0)
Monocytes Absolute: 0.6 10*3/uL (ref 0.1–1.0)
Monocytes Relative: 8.4 % (ref 3.0–12.0)
Neutro Abs: 5.2 10*3/uL (ref 1.4–7.7)
Neutrophils Relative %: 75.5 % (ref 43.0–77.0)
Platelets: 196 10*3/uL (ref 150.0–400.0)
RBC: 3.98 Mil/uL (ref 3.87–5.11)
RDW: 14.2 % (ref 11.5–15.5)
WBC: 6.9 10*3/uL (ref 4.0–10.5)

## 2020-04-14 LAB — SEDIMENTATION RATE: Sed Rate: 23 mm/hr (ref 0–30)

## 2020-04-14 NOTE — Progress Notes (Signed)
Subjective:    Patient ID: Jaclyn Hill, female    DOB: Apr 15, 1934, 86 y.o.   MRN: [id number]  HPI Jaclyn Hill is a pleasant 86 year old African-American female, established with Dr. Henrene Pastor, who was seen in the office on 02/11/2020 in referral from Dr. Reynaldo Minium for evaluation of weight loss and abnormal CT from October 2021 which showed some soft tissue thickening at the EG junction and gastric fundus. Patient comes in today for follow-up.  Prior to being seen at that initial visit she had lost about 13 pounds over the previous 1 year.  She says she has lost an additional 2 pounds since she was seen a month ago. She underwent upper endoscopy with Dr. Henrene Pastor on 02/22/2020 with finding of a 5 mm prepyloric ulcer, the remainder of the stomach and esophagus appeared normal.  Biopsy from the stomach was benign.  She has been on pantoprazole 40 mg p.o. every morning. She says overall she has been feeling well and has no specific GI complaints.  She has never had any complaint of abdominal pain, no nausea or vomiting.  She says her appetite is okay she just does not get is hungry as she used to and says she thinks that she has lost weight because she is just not eating as much as she used to.  She and her husband both relate that she still eats quite a bit of junk food and also likes sweets. She has not had any changes in bowel habits, no regular issues with constipation no melena or hematochezia. Last labs from fall 2021 creatinine 1.5 hemoglobin 10.3 hematocrit of 32.9 MCV of 82. She does have history of chronic kidney disease stage III, hypertension, chronic anemia and is status post cholecystectomy. She has not been back to see Dr. Reynaldo Minium, and is not had any other recent imaging.  Review of Systems Pertinent positive and negative review of systems were noted in the above HPI section.  All other review of systems was otherwise negative.  Outpatient Encounter Medications as of 04/14/2020  Medication Sig  .  aspirin EC 81 MG tablet Take 81 mg by mouth daily.  . calcium-vitamin D (OSCAL WITH D) 500-200 MG-UNIT tablet Take 1 tablet by mouth daily.  . Cyanocobalamin (B-12 IJ) Inject 1 mL as directed every 30 (thirty) days.  Marland Kitchen glucosamine-chondroitin 500-400 MG tablet Take 1 tablet by mouth 2 (two) times daily.  Marland Kitchen lisinopril-hydrochlorothiazide (ZESTORETIC) 20-12.5 MG tablet Take 1 tablet by mouth daily.  . magnesium oxide (MAG-OX) 400 MG tablet Take 400 mg by mouth daily. hs  . Multiple Vitamin (MULTIVITAMIN) capsule Take 1 capsule by mouth daily.  . pantoprazole (PROTONIX) 40 MG tablet Take 1 tablet (40 mg total) by mouth daily.  . [DISCONTINUED] cyanocobalamin (,VITAMIN B-12,) 1000 MCG/ML injection INJECT 1 ML MONTHLY  . [DISCONTINUED] lisinopril-hydrochlorothiazide (PRINZIDE,ZESTORETIC) 20-12.5 MG tablet lisinopril 20 mg-hydrochlorothiazide 12.5 mg tablet  . [DISCONTINUED] SYMBICORT 160-4.5 MCG/ACT inhaler Inhale 2 puffs into the lungs 2 (two) times daily.   No facility-administered encounter medications on file as of 04/14/2020.   No Known Allergies Patient Active Problem List   Diagnosis Date Noted  . Osteopenia 08/02/2017  . Gastroesophageal reflux disease 08/02/2017  . Arthritis 08/02/2017  . Dense breasts 01/06/2012  . Breast calcification, right 01/06/2012  . Hypertension 02/12/2002  . Menopausal symptoms 08/24/1988   Social History   Socioeconomic History  . Marital status: Married    Spouse name: Not on file  . Number of children: Not on file  .  Years of education: Not on file  . Highest education level: Not on file  Occupational History  . Not on file  Tobacco Use  . Smoking status: Former Smoker    Types: Cigarettes    Quit date: 2005    Years since quitting: 17.1  . Smokeless tobacco: Never Used  . Tobacco comment: had smoked 40 years on and off  Vaping Use  . Vaping Use: Never used  Substance and Sexual Activity  . Alcohol use: Never  . Drug use: Never  . Sexual  activity: Not Currently  Other Topics Concern  . Not on file  Social History Narrative  . Not on file   Social Determinants of Health   Financial Resource Strain: Not on file  Food Insecurity: Not on file  Transportation Needs: Not on file  Physical Activity: Not on file  Stress: Not on file  Social Connections: Not on file  Intimate Partner Violence: Not on file    Jaclyn Hill's family history includes Breast cancer in her mother; Diabetes in her sister.      Objective:    Vitals:   04/14/20 1103  BP: 120/68  Pulse: 86  SpO2: 99%    Physical Exam Well-developed well-nourished elderly AA female in no acute distress.  Height, [id number]  BMI 26  HEENT; nontraumatic normocephalic, EOMI, PE R LA, sclera anicteric. Oropharynx; not done Neck; supple, no JVD Cardiovascular; regular rate and rhythm with S1-S2, no murmur rub or gallop Pulmonary; Clear bilaterally Abdomen; soft, nontender, nondistended, no palpable mass or hepatosplenomegaly, bowel sounds are active Rectal; not done Skin; benign exam, no jaundice rash or appreciable lesions Extremities; no clubbing cyanosis or edema skin warm and dry Neuro/Psych; alert and oriented x4, grossly nonfocal mood and affect appropriate       Assessment & Plan:   #20 86 year old African-American female with 15 pound weight loss over the past 14 months.  Patient had undergone CT of the abdomen and pelvis October 2021 with note of thickening at the GE junction and fundus. Subsequent EGD unremarkable other than 5 mm prepyloric ulcer which is benign and is being treated with pantoprazole.  Patient does not have any other GI symptoms, she has lost an additional 2 pounds since her last visit here 2 months ago.  She feels that she is just eating less than she used to as a younger person.  Energy level has been fine, appetite okay.  Etiology of her weight loss is not entirely clear, rule out occult malignancy  #2 chronic kidney disease  stage III 3.  Hypertension 4.  Chronic anemia 5.  Status post cholecystectomy.  Plan; CBC with differential, c-Met, sed rate Patient will be scheduled for CT scan of the chest without IV contrast. Continue Protonix 40 mg p.o. every morning through March 2022 then discontinue. She has office follow-up with Dr. Joya Salm later this month. Further recommendations pending results of above.   [place] Genia Harold PA-C 04/14/2020   Cc: Burnard Bunting, MD

## 2020-04-14 NOTE — Patient Instructions (Signed)
If you are age 84 or older, your body mass index should be between 23-30. Your Body mass index is 26.12 kg/m. If this is out of the aforementioned range listed, please consider follow up with your Primary Care Provider.  If you are age 77 or younger, your body mass index should be between 19-25. Your Body mass index is 26.12 kg/m. If this is out of the aformentioned range listed, please consider follow up with your Primary Care Provider.   Your provider has requested that you go to the basement level for lab work before leaving today. Press "B" on the elevator. The lab is located at the first door on the left as you exit the elevator.  You have been scheduled for a CT scan of the Chest at [facility], 1st floor Radiology. You are scheduled on 04/22/2020  at 3:30 pm. You should arrive 15 minutes prior to your appointment time for registration.    If you have any questions regarding your exam or if you need to reschedule, you may call [facility] at [phone] between the hours of 8:00 am and 5:00 pm, Monday-Friday.   Continue to use Pantoprazole 40 mg once daily through March then STOP.  Keep your follow up with Dr. Jacky Kindle.  Thank you for entrusting me with your care and choosing [facility].  Amy Esterwood, PA-C

## 2020-04-14 NOTE — Progress Notes (Signed)
Assessment and plan noted ?

## 2020-04-22 ENCOUNTER — Ambulatory Visit (HOSPITAL_COMMUNITY)
Admission: RE | Admit: 2020-04-22 | Discharge: 2020-04-22 | Disposition: A | Discharge status: Home / Self Care | Payer: Medicare Other | Source: Ambulatory Visit | Attending: Physician Assistant | Admitting: Physician Assistant

## 2020-04-22 ENCOUNTER — Other Ambulatory Visit: Payer: Self-pay

## 2020-04-22 DIAGNOSIS — R634 Abnormal weight loss: Secondary | ICD-10-CM | POA: Diagnosis present

## 2020-04-22 DIAGNOSIS — J439 Emphysema, unspecified: Secondary | ICD-10-CM | POA: Insufficient documentation

## 2020-04-22 DIAGNOSIS — I2510 Atherosclerotic heart disease of native coronary artery without angina pectoris: Secondary | ICD-10-CM | POA: Insufficient documentation

## 2020-04-22 DIAGNOSIS — I700 Atherosclerosis of aorta: Secondary | ICD-10-CM | POA: Diagnosis not present

## 2020-04-22 DIAGNOSIS — K259 Gastric ulcer, unspecified as acute or chronic, without hemorrhage or perforation: Secondary | ICD-10-CM | POA: Diagnosis present

## 2021-12-17 IMAGING — CT CT ABD-PELV W/O CM
1 of 2 series · 13 of 32 positions shown, 18 images · non-contrast
Comparison: None.

CLINICAL DATA: Unexplained weight loss.  Renal insufficiency.

EXAM:
CT ABDOMEN AND PELVIS WITHOUT CONTRAST
TECHNIQUE: Multidetector CT imaging of the abdomen and pelvis was performed
following the standard protocol without IV contrast.

[Series 2: abd/pelvis w/(date) · axial · 0.75mm/px · z∈[-380,-25]mm · 13 of 81 slices shown, 18 images]
[im 5/81  soft-tissue]
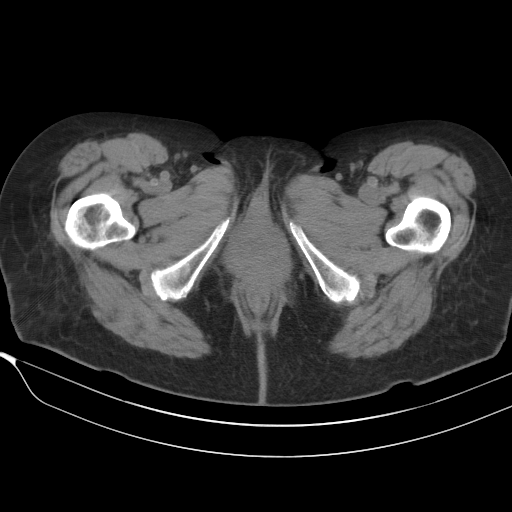
[im 5/81  bone]
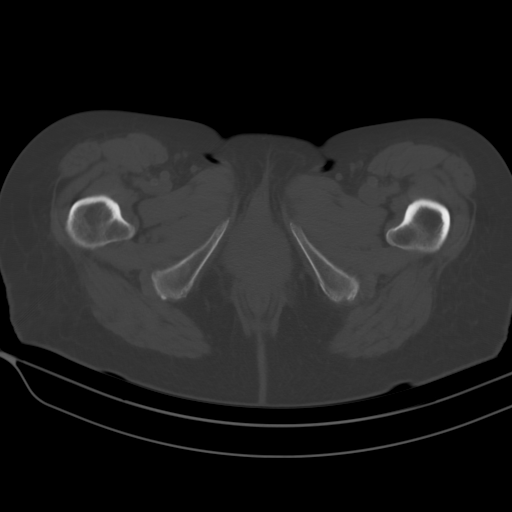
[im 14/81  soft-tissue]
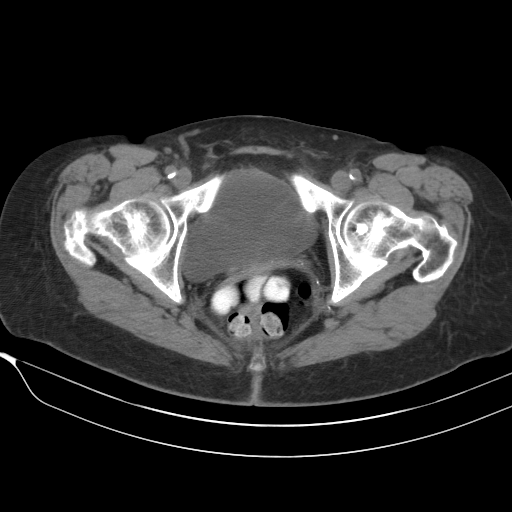
[im 18/81  soft-tissue]
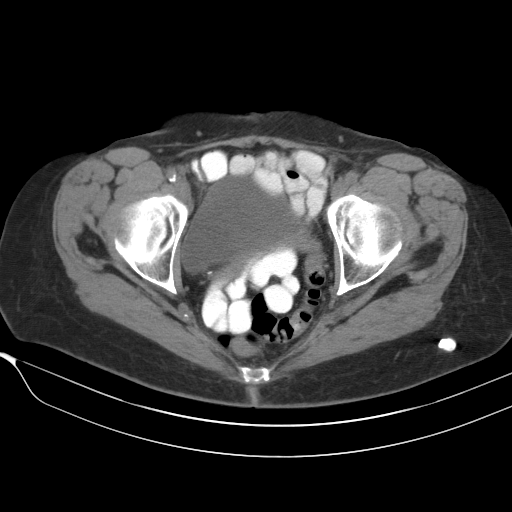
[im 23/81  soft-tissue]
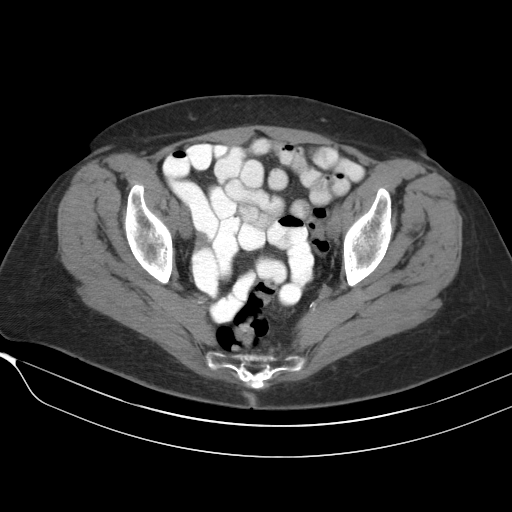
[im 32/81  soft-tissue]
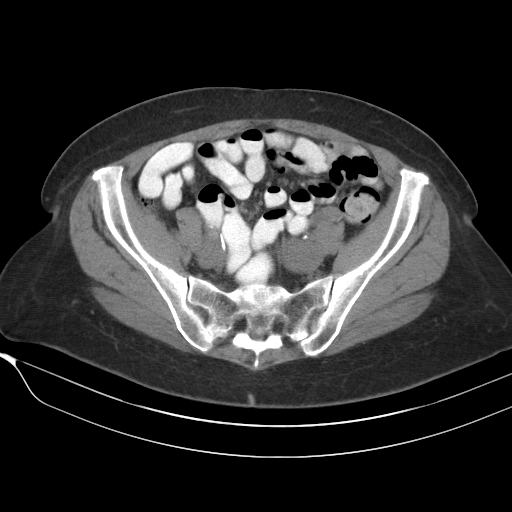
[im 36/81  soft-tissue]
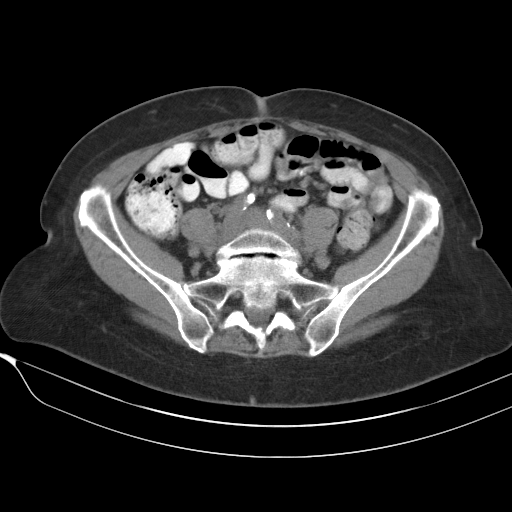
[im 45/81  soft-tissue]
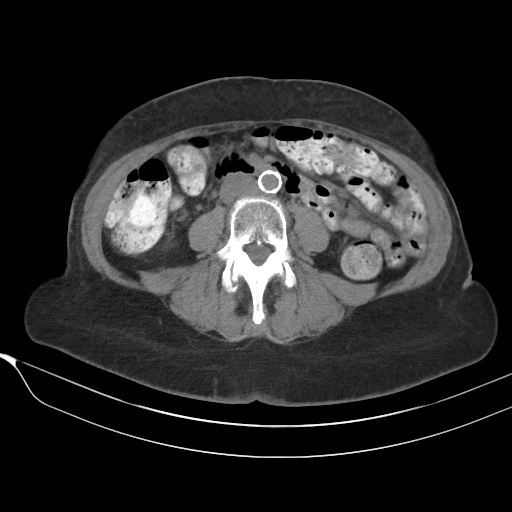
[im 49/81  soft-tissue]
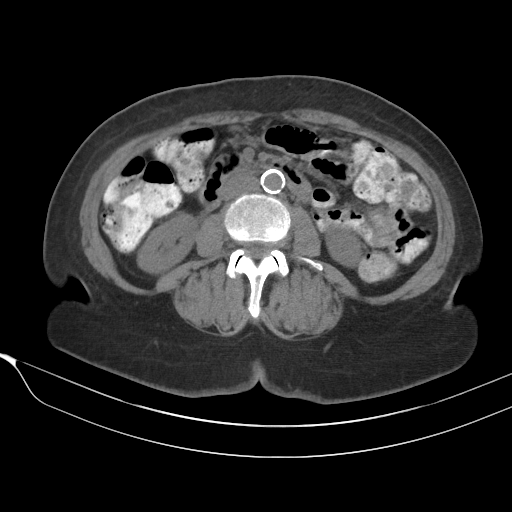
[im 58/81  soft-tissue]
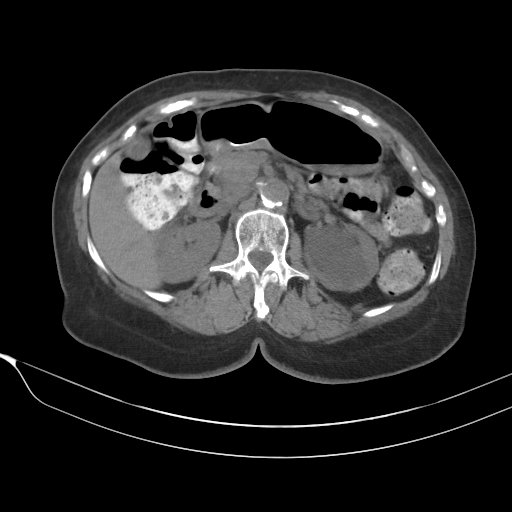
[im 58/81  bone]
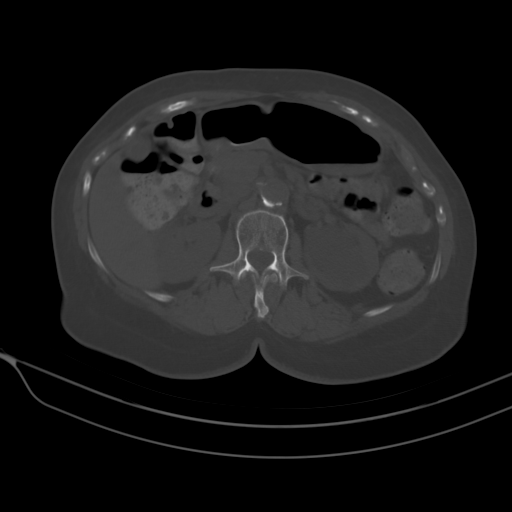
[im 63/81  soft-tissue]
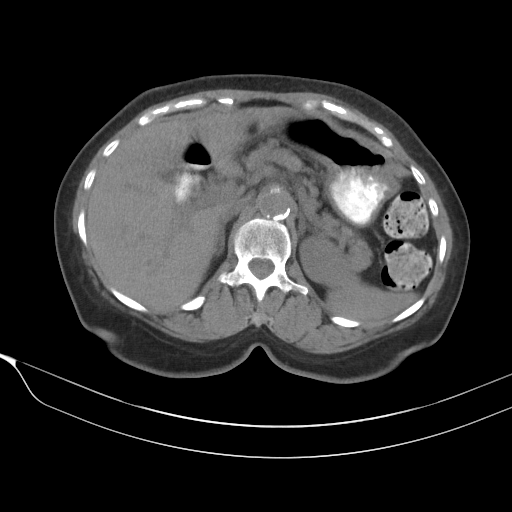
[im 63/81  lung]
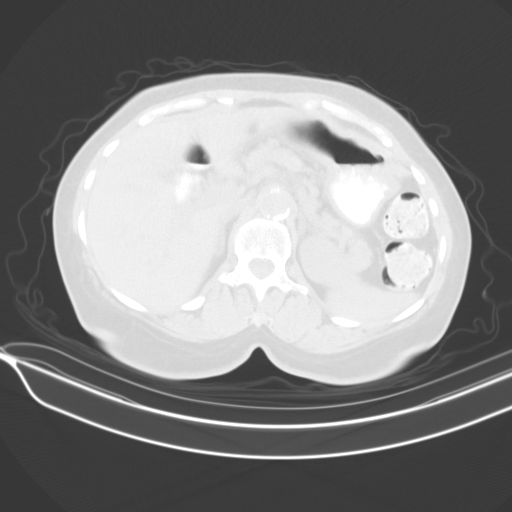
[im 67/81  soft-tissue]
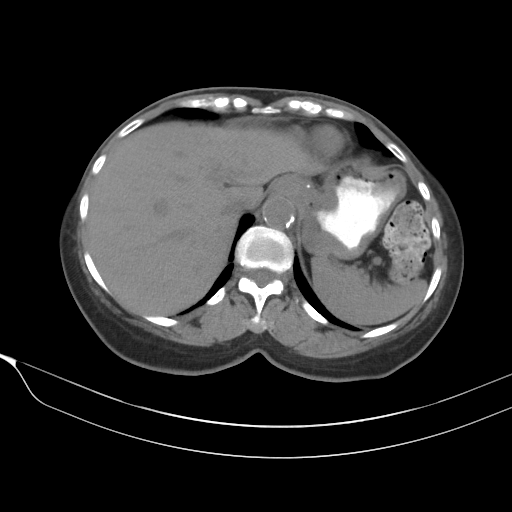
[im 67/81  lung]
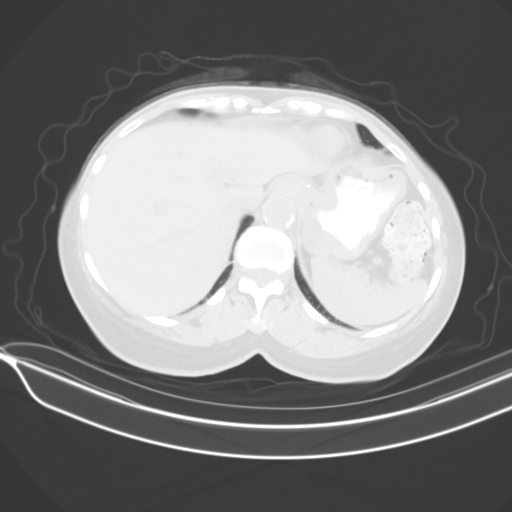
[im 72/81  lung]
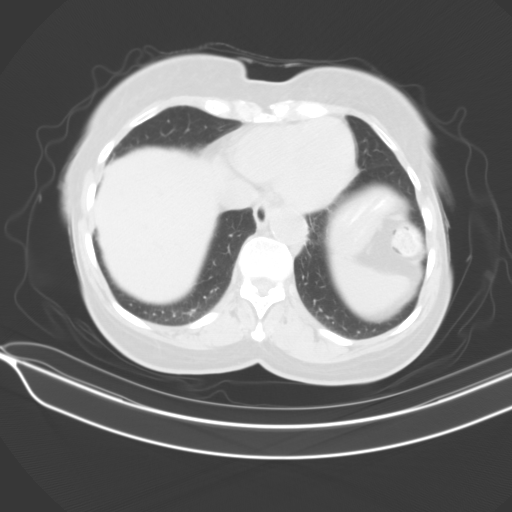
[im 76/81  soft-tissue]
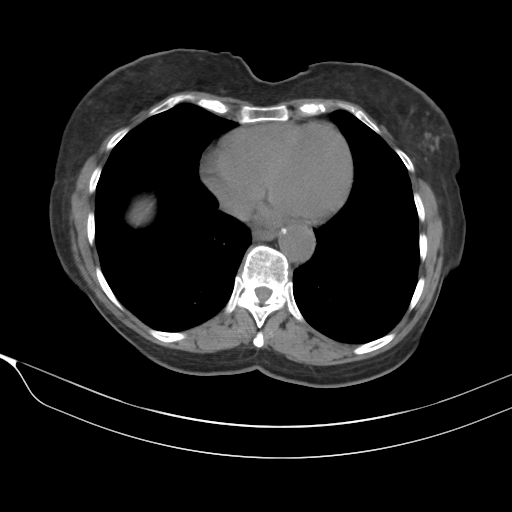
[im 76/81  lung]
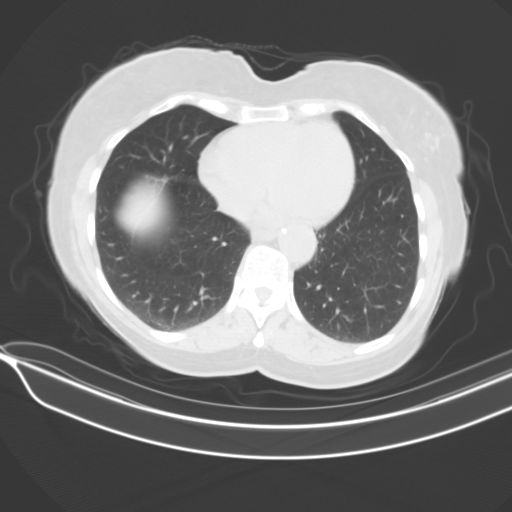

[13 of 32 positions shown; findings below may reference images not displayed]

FINDINGS: Lower chest: Unremarkable.

Hepatobiliary: No focal abnormality in the liver on this study
without intravenous contrast. Gallbladder is surgically absent. No
intrahepatic or extrahepatic biliary dilatation.

Pancreas: No focal mass lesion. No dilatation of the main duct. No
intraparenchymal cyst. No peripancreatic edema.

Spleen: No splenomegaly. No focal mass lesion.

Adrenals/Urinary Tract: No adrenal nodule or mass. Right kidney
unremarkable. 5.8 cm lesion interpolar left kidney measures water
density. There is some fullness of the upper and lower pole calices.
No evidence for hydroureter. The urinary bladder appears normal for
the degree of distention.

Stomach/Bowel: Soft tissue thickening noted in the region of the
esophagogastric junction and gastric fundus. Duodenum is normally
positioned as is the ligament of Treitz. No small bowel wall
thickening. No small bowel dilatation. The terminal ileum is normal.
The appendix is not visualized, but there is no edema or
inflammation in the region of the cecum. No gross colonic mass. No
colonic wall thickening.

Vascular/Lymphatic: There is abdominal aortic atherosclerosis
without aneurysm. There is no gastrohepatic or hepatoduodenal
ligament lymphadenopathy. No retroperitoneal or mesenteric
lymphadenopathy. No pelvic sidewall lymphadenopathy.

Reproductive: Uterus surgically absent.  There is no adnexal mass.

Other: No intraperitoneal free fluid.

Musculoskeletal: No worrisome lytic or sclerotic osseous
abnormality. Advanced degenerative changes noted in the lower lumbar
facets with grade 1-2 anterolisthesis of L5 on S1.
IMPRESSION: 1. Soft tissue thickening in the region of the esophagogastric
junction and gastric fundus. Imaging features are nonspecific and
may be related to underdistention. Given the history of weight loss,
upper endoscopy may be warranted to further evaluate.
2. 5.8 cm water density lesion interpolar left kidney with some
fullness of the upper and lower pole calices. This is probably a
central sinus cyst, but communication with the renal pelvis and
urothelial lesion cannot be entirely excluded. MRI of the abdomen
without and with contrast recommended.
3. Aortic Atherosclerosis ([id number]).

## 2022-01-17 ENCOUNTER — Other Ambulatory Visit: Payer: Self-pay | Admitting: Internal Medicine

## 2022-01-17 DIAGNOSIS — R634 Abnormal weight loss: Secondary | ICD-10-CM

## 2022-01-18 ENCOUNTER — Ambulatory Visit
Admission: RE | Admit: 2022-01-18 | Discharge: 2022-01-18 | Disposition: A | Discharge status: Home / Self Care | Payer: Medicare Other | Source: Ambulatory Visit | Attending: Internal Medicine | Admitting: Internal Medicine

## 2022-01-18 DIAGNOSIS — R634 Abnormal weight loss: Secondary | ICD-10-CM

## 2022-01-18 MED ORDER — IOPAMIDOL (ISOVUE-300) INJECTION 61%
100.0000 mL | Freq: Once | INTRAVENOUS | Status: AC | PRN
  Administered 2022-01-18: 100 mL via INTRAVENOUS

## 2022-04-17 IMAGING — CT CT CHEST W/O CM
2 of 4 series · 15 of 36 positions shown, 18 images · non-contrast
Comparison: None.

CLINICAL DATA: Productive cough with unintentional weight loss.

EXAM:
CT CHEST WITHOUT CONTRAST
TECHNIQUE: Multidetector CT imaging of the chest was performed following the
standard protocol without IV contrast.

[Series 2: thorax · axial · 0.67mm/px · z∈[-38,+204]mm · 12 of 145 slices shown, 15 images]
[im 12/145  mediastinal]
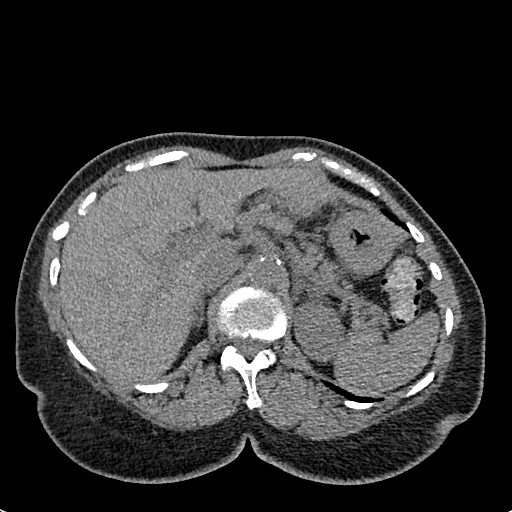
[im 12/145  lung]
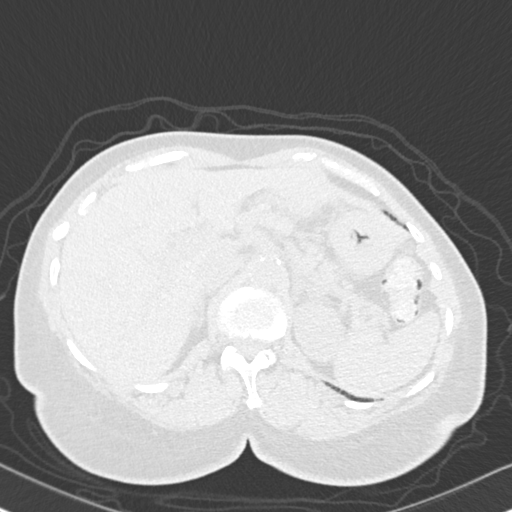
[im 23/145  lung]
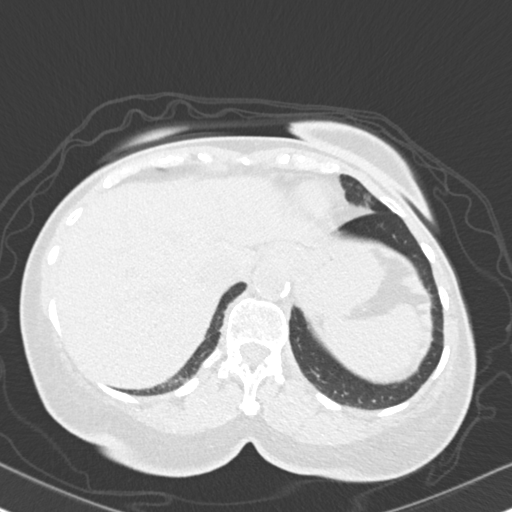
[im 34/145  lung]
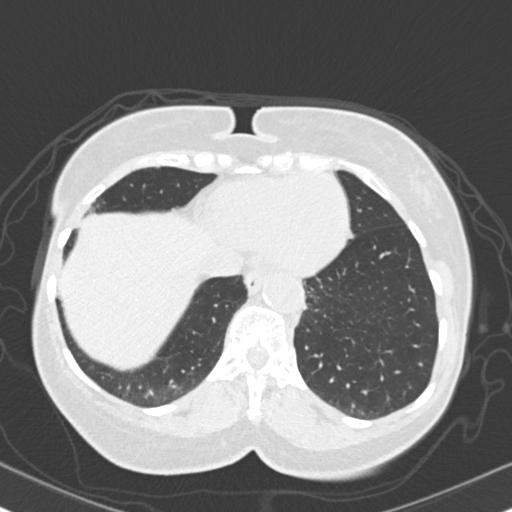
[im 45/145  lung]
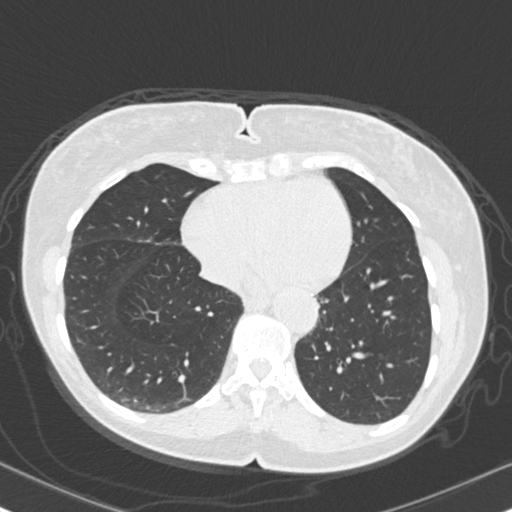
[im 56/145  mediastinal]
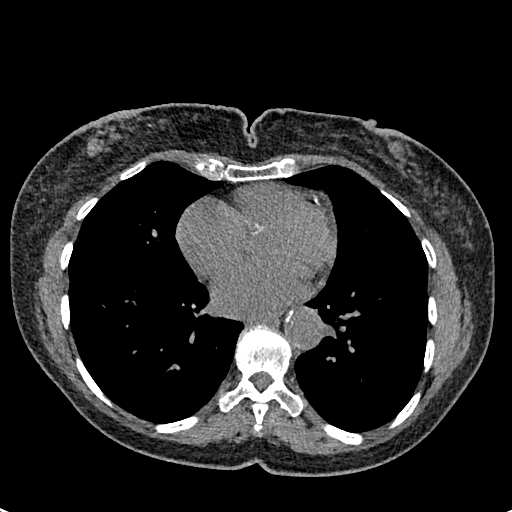
[im 56/145  lung]
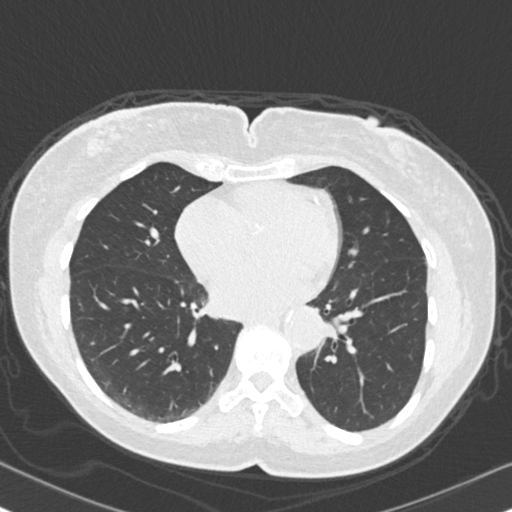
[im 67/145  lung]
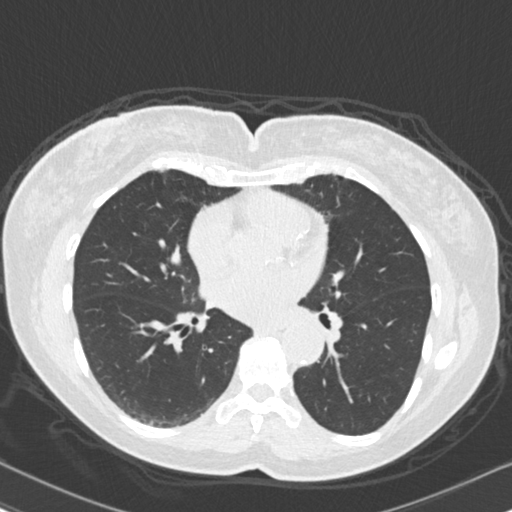
[im 78/145  lung]
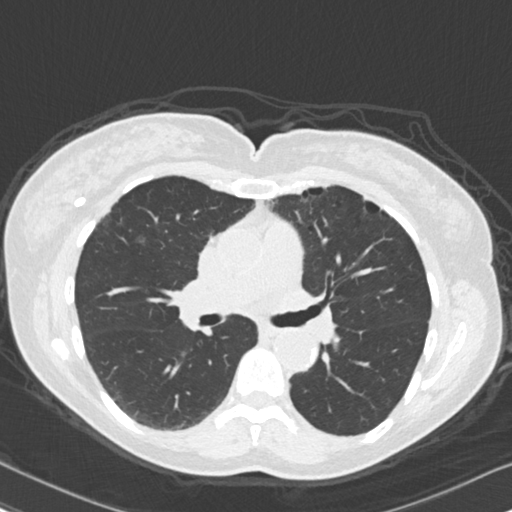
[im 89/145  lung]
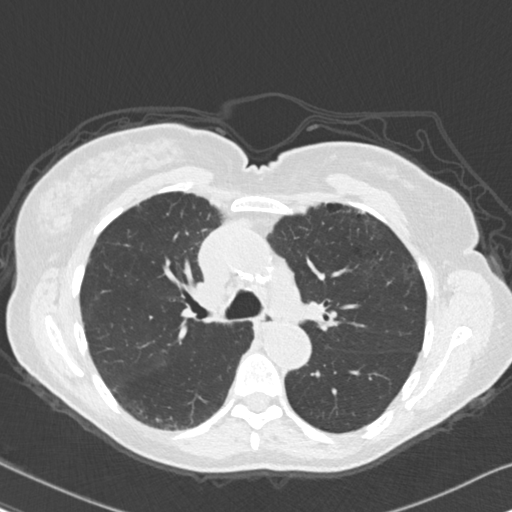
[im 100/145  mediastinal]
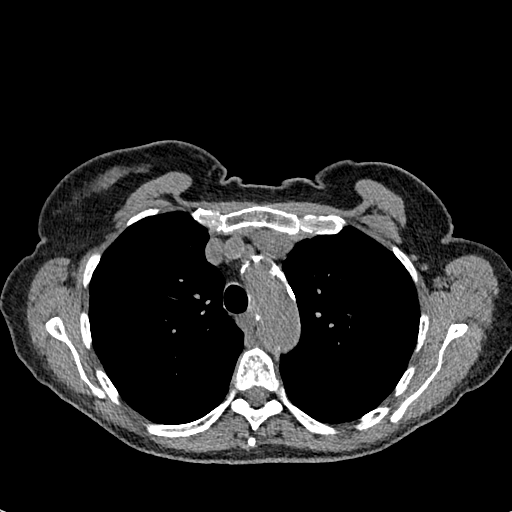
[im 100/145  lung]
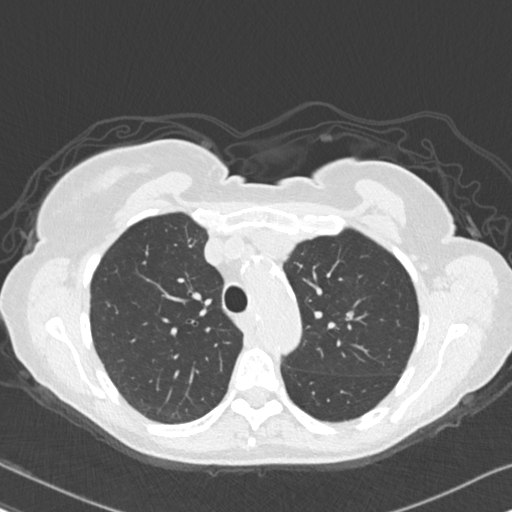
[im 111/145  lung]
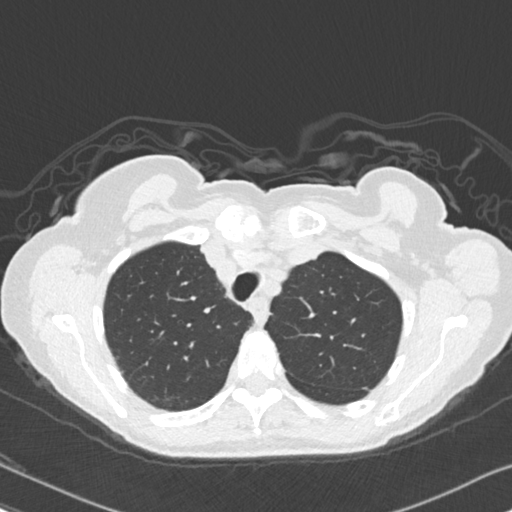
[im 122/145  lung]
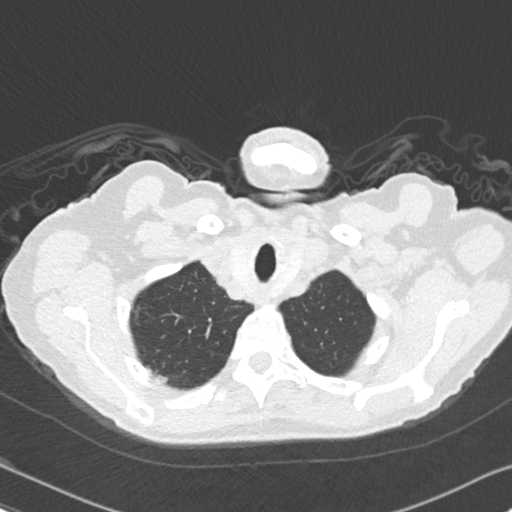
[im 133/145  lung]
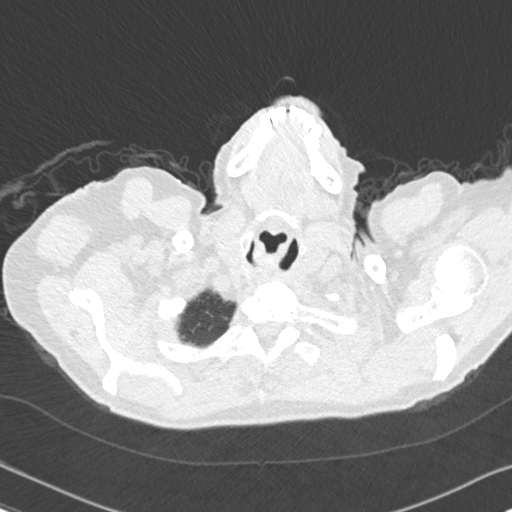

[Series 5: coronal · coronal · 0.60mm/px · 3 of 129 slices shown]
[im 26/129  lung]
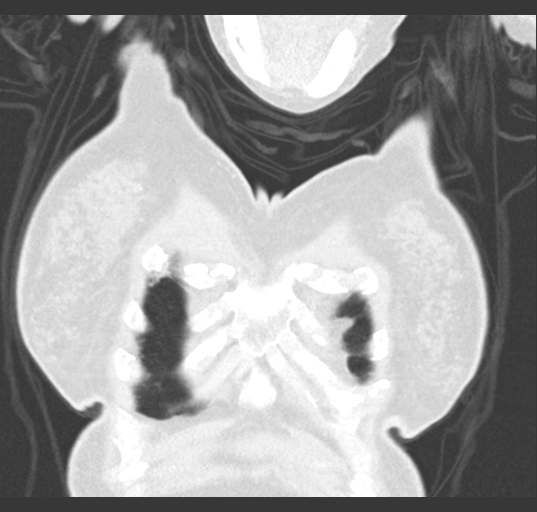
[im 52/129  lung]
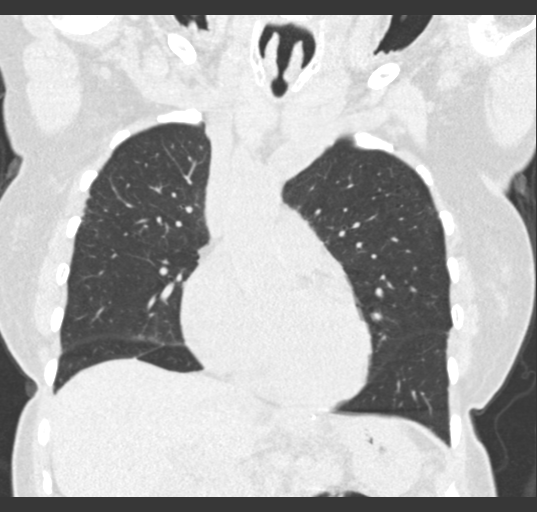
[im 77/129  lung]
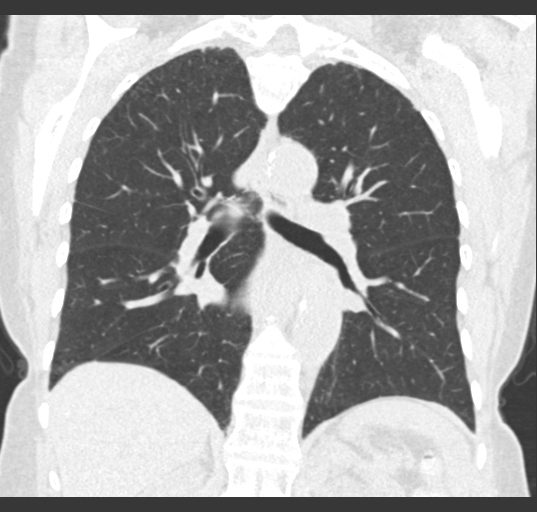

[15 of 36 positions shown; findings below may reference images not displayed]

FINDINGS: Cardiovascular: Normal heart size. No significant pericardial
effusion/thickening. Left anterior descending and left circumflex
coronary atherosclerosis. Atherosclerotic nonaneurysmal thoracic
aorta. Normal caliber pulmonary arteries.

Mediastinum/Nodes: Subcentimeter hypodense superior right thyroid
nodule. Not clinically significant; no follow-up imaging recommended
(ref: [HOSPITAL]. [DATE]): 143-50). Unremarkable
esophagus. No pathologically enlarged axillary, mediastinal or hilar
lymph nodes, noting limited sensitivity for the detection of hilar
adenopathy on this noncontrast study.

Lungs/Pleura: No pneumothorax. No pleural effusion. Mild
centrilobular and paraseptal emphysema. Solid 4 mm apical right
upper lobe pulmonary nodule (series 6/image 17). No acute
consolidative airspace disease, lung masses or additional
significant pulmonary nodules. Very mild patchy subpleural
reticulation and ground-glass opacity in both lungs, asymmetrically
prominent in the right lung, with a dependent lower lobe
predominance. No significant regions of traction bronchiectasis,
architectural distortion or frank honeycombing.

Upper abdomen: Cystic prominence in the visualized left renal sinus
is incompletely evaluated on this scan, not appreciably changed from
12/23/2019 CT, favoring a peripelvic renal cyst.

Musculoskeletal: No aggressive appearing focal osseous lesions.
Moderate thoracic spondylosis.
IMPRESSION: 1. Very mild patchy subpleural reticulation and ground-glass opacity
in both lungs, asymmetrically prominent in the right lung, with a
dependent lower lobe predominance. No bronchiectasis or
honeycombing. Findings are nonspecific and could be due to
hypoventilatory change, nonspecific postinfectious/postinflammatory
scarring or a mild interstitial lung disease such as nonspecific
interstitial pneumonia (NSIP) or early usual interstitial pneumonia
(UIP).
2. Solid 4 mm apical right upper lobe pulmonary nodule. Follow-up
high-resolution chest CT study recommended in 12 months in this high
risk patient. This recommendation follows the consensus statement:
Guidelines for Management of Incidental Pulmonary Nodules Detected
3. Two vessel coronary atherosclerosis.
4. Aortic Atherosclerosis ([id number]) and Emphysema ([id number].M).
